# Patient Record
Sex: Male | Born: 2000 | Race: White | Hispanic: No | Marital: Single | Smoking: Never smoker
Health system: Southern US, Community
[De-identification: ages and names within clinical notes are randomized; demographics above are authoritative.]

## PROBLEM LIST (undated history)

## (undated) DIAGNOSIS — H9190 Unspecified hearing loss, unspecified ear: Secondary | ICD-10-CM

## (undated) DIAGNOSIS — J3081 Allergic rhinitis due to animal (cat) (dog) hair and dander: Secondary | ICD-10-CM

## (undated) DIAGNOSIS — IMO0001 Reserved for inherently not codable concepts without codable children: Secondary | ICD-10-CM

## (undated) DIAGNOSIS — R55 Syncope and collapse: Secondary | ICD-10-CM

## (undated) DIAGNOSIS — L929 Granulomatous disorder of the skin and subcutaneous tissue, unspecified: Secondary | ICD-10-CM

## (undated) HISTORY — DX: Syncope and collapse: R55

---
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
## 2021-04-16 ENCOUNTER — Encounter (HOSPITAL_BASED_OUTPATIENT_CLINIC_OR_DEPARTMENT_OTHER): Payer: Self-pay | Admitting: *Deleted

## 2021-04-16 ENCOUNTER — Emergency Department (HOSPITAL_BASED_OUTPATIENT_CLINIC_OR_DEPARTMENT_OTHER)
Admission: EM | Admit: 2021-04-16 | Discharge: 2021-04-16 | Disposition: A | Discharge status: Home / Self Care | Payer: Medicaid Other | Attending: Emergency Medicine | Admitting: Emergency Medicine

## 2021-04-16 ENCOUNTER — Other Ambulatory Visit: Payer: Self-pay

## 2021-04-16 DIAGNOSIS — L040 Acute lymphadenitis of face, head and neck: Secondary | ICD-10-CM | POA: Insufficient documentation

## 2021-04-16 DIAGNOSIS — Z7722 Contact with and (suspected) exposure to environmental tobacco smoke (acute) (chronic): Secondary | ICD-10-CM | POA: Insufficient documentation

## 2021-04-16 DIAGNOSIS — R590 Localized enlarged lymph nodes: Secondary | ICD-10-CM

## 2021-04-16 NOTE — ED Triage Notes (Signed)
Enlarged node on the left side of his neck. Pt is deaf.

## 2021-04-16 NOTE — Discharge Instructions (Addendum)
Recommend calling your primary care doctor to get follow-up appointment.  Discussed her symptoms from today.  In one of your neck lymph nodes on the left was mildly swollen.  They should recheck this.  If you develop fever, difficulty breathing, worsening neck swelling, or other new concerning symptom, come back to ER for reassessment.  For the aches and pains in your joints, recommend trying Motrin as needed.

## 2021-04-17 NOTE — ED Provider Notes (Signed)
[facility] EMERGENCY DEPARTMENT Provider Note   CSN: [id number] Arrival date & time: 04/16/21  2133     History Chief Complaint  Patient presents with   Lymphadenopathy    Jason Gentry is a 20 y.o. male.  Utilized [employer] for [employer].  Patient reports that over the past couple days he is noted small area of swelling on his left neck.  Slightly tender to touch but does not have any pain at present.  No generalized neck swelling, no neck stiffness, no fevers or chills.  No weight loss.  On review of systems he does endorse some joint pain, has had pain in some of his shoulders past few weeks has tried aspirin with no relief.  Not currently having any significant pain.  Denies any medical problems.  Deaf.  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    Past Surgical History:  Procedure Laterality Date   MASS EXCISION Right 04/03/2015   Procedure: EXCISION OF INFECTED GRANULOMA ON RIGHT SHOULDER;  Surgeon: Leonia Corona, MD;  Location: [facility];  Service: Pediatrics;  Laterality: Right;       Family History  Problem Relation Age of Onset   Hypertension Maternal Grandmother    Deafness Mother    Deafness Brother    Hypertension Maternal Grandfather    Migraines Neg Hx    Seizures Neg Hx    Depression Neg Hx    Anxiety disorder Neg Hx    Bipolar disorder Neg Hx    Schizophrenia Neg Hx    ADD / ADHD Neg Hx    Autism Neg Hx     Social History   Tobacco Use   Smoking status: Passive Smoke Exposure - Never Smoker   Smokeless tobacco: Never   Tobacco comments:    father smokes, but not at home   Substance Use Topics   Alcohol use: No   Drug use: No    Home Medications Prior to Admission medications   Medication Sig Start Date End Date Taking? Authorizing Provider  meclizine (ANTIVERT) 12.5 MG tablet Take 1 tablet (12.5 mg total) by mouth 3 (three) times daily as needed for dizziness. Patient not taking: No sig reported 10/08/17   Mackuen, Cindee Salt, MD    Allergies    Penicillins  Review of Systems   Review of Systems  Constitutional:  Negative for chills and fever.  HENT:  Negative for ear pain and sore throat.   Eyes:  Negative for pain and visual disturbance.  Respiratory:  Negative for cough and shortness of breath.   Cardiovascular:  Negative for chest pain and palpitations.  Gastrointestinal:  Negative for abdominal pain and vomiting.  Genitourinary:  Negative for dysuria and hematuria.  Musculoskeletal:  Positive for arthralgias and myalgias. Negative for back pain.  Skin:  Negative for color change and rash.  Neurological:  Negative for seizures and syncope.  All other systems reviewed and are negative.  Physical Exam Updated Vital Signs BP (!) 148/87 (BP Location: Left Arm)   Pulse (!) 104   Temp 98.6 F (  37 C) (Oral)   Resp 18   Ht 5\' 11"  (1.803 m)   Wt 70.7 kg   SpO2 100%   BMI 21.74 kg/m   Physical Exam Vitals and nursing note reviewed.  Constitutional:      Appearance: He is well-developed.  HENT:     Head: Normocephalic and atraumatic.     Mouth/Throat:     Mouth: Mucous membranes are moist.     Pharynx: Oropharynx is clear. No oropharyngeal exudate or posterior oropharyngeal erythema.  Eyes:     Conjunctiva/sclera: Conjunctivae normal.  Neck:     Comments: There is a less than 1 cm palpable cervical lymph node, firm, nontender, no overlying erythema or induration appreciated, neck is otherwise supple, normal range of motion Cardiovascular:     Rate and Rhythm: Normal rate and regular rhythm.     Heart sounds: No murmur  heard. Pulmonary:     Effort: Pulmonary effort is normal. No respiratory distress.     Breath sounds: Normal breath sounds.  Abdominal:     Palpations: Abdomen is soft.     Tenderness: There is no abdominal tenderness.  Musculoskeletal:     Cervical back: Neck supple.     Comments: No tenderness over shoulders, normal shoulder range of motion  Skin:    General: Skin is warm and dry.  Neurological:     Mental Status: He is alert.    ED Results / Procedures / Treatments   Labs (all labs ordered are listed, but only abnormal results are displayed) Labs Reviewed - No data to display  EKG None  Radiology No results found.  Procedures Procedures   Medications Ordered in ED Medications - No data to display  ED Course  I have reviewed the triage vital signs and the nursing notes.  Pertinent labs & imaging results that were available during my care of the patient were reviewed by me and considered in my medical decision making (see chart for details).    MDM Rules/Calculators/A&P                          20 year old presents to ER with concern for bump on his neck.  Based on physical exam suspect he has some lymphadenopathy.  HEENT exam otherwise unremarkable.  No erythema or induration or fever to suggest infectious process at present.  Recommend he follow-up with his primary doctor for recheck and monitoring.  If there is persistent swelling, worsening swelling, discussed with patient potential need for labs and imaging.  Given the size and appearance today however do not feel this is necessary. Demonstrated understanding of dc plan.    After the discussed management above, the patient was determined to be safe for discharge.  The patient was in agreement with this plan and all questions regarding their care were answered.  ED return precautions were discussed and the patient will return to the ED with any significant worsening of condition.  Final Clinical Impression(s) / ED  Diagnoses Final diagnoses:  Cervical lymphadenopathy    Rx / DC Orders ED Discharge Orders     None        15, MD 04/17/21 1646

## 2021-04-30 ENCOUNTER — Encounter (HOSPITAL_BASED_OUTPATIENT_CLINIC_OR_DEPARTMENT_OTHER): Payer: Self-pay | Admitting: *Deleted

## 2021-04-30 ENCOUNTER — Other Ambulatory Visit: Payer: Self-pay

## 2021-04-30 DIAGNOSIS — M549 Dorsalgia, unspecified: Secondary | ICD-10-CM | POA: Insufficient documentation

## 2021-04-30 DIAGNOSIS — R0781 Pleurodynia: Secondary | ICD-10-CM | POA: Diagnosis present

## 2021-04-30 LAB — URINALYSIS, ROUTINE W REFLEX MICROSCOPIC
Bilirubin Urine: NEGATIVE
Glucose, UA: NEGATIVE mg/dL
Hgb urine dipstick: NEGATIVE
Ketones, ur: NEGATIVE mg/dL
Leukocytes,Ua: NEGATIVE
Nitrite: NEGATIVE
Protein, ur: NEGATIVE mg/dL
Specific Gravity, Urine: 1.015 (ref 1.005–1.030)
pH: 7.5 (ref 5.0–8.0)

## 2021-04-30 NOTE — ED Triage Notes (Signed)
Left flank pain. He feels it is kidney related.

## 2021-05-01 ENCOUNTER — Emergency Department (HOSPITAL_BASED_OUTPATIENT_CLINIC_OR_DEPARTMENT_OTHER): Payer: Medicaid Other

## 2021-05-01 ENCOUNTER — Emergency Department (HOSPITAL_BASED_OUTPATIENT_CLINIC_OR_DEPARTMENT_OTHER)
Admission: EM | Admit: 2021-05-01 | Discharge: 2021-05-01 | Disposition: A | Discharge status: Home / Self Care | Payer: Medicaid Other | Attending: Emergency Medicine | Admitting: Emergency Medicine

## 2021-05-01 DIAGNOSIS — R0789 Other chest pain: Secondary | ICD-10-CM

## 2021-05-01 MED ORDER — NAPROXEN 375 MG PO TABS: ORAL_TABLET | ORAL | 0 refills | Status: DC

## 2021-05-01 MED ORDER — NAPROXEN 250 MG PO TABS
500.0000 mg | ORAL_TABLET | Freq: Once | ORAL | Status: AC
  Administered 2021-05-01: 500 mg via ORAL
  Filled 2021-05-01: qty 2

## 2021-05-01 NOTE — ED Provider Notes (Signed)
[facility] Provider Note: Jason Dell, MD, FACEP  CSN: [id number] MRN: [id number] ARRIVAL: 04/30/21 at 2119 ROOM: [facility]01/[facility]01   CHIEF COMPLAINT  Back Pain  Patient is deaf.  No in-person or Internet sign language interpreter available.  History was obtained through written questions and answers. HISTORY OF PRESENT ILLNESS  05/01/21 2:56 AM Jason Rushing D Gentry is a 20 y.o. male who has had left lower posterolateral rib pain for about the past 3 weeks.  He describes it as well localized and fairly sharp.  He rates it as about a 5 out of 10.  It is not worse with movement but is somewhat worse with breathing.  He describes it as a deep pain.  He is not short of breath.  He may have done some heavy lifting that precipitated this but he is not sure.  He states the pain is constant and he has taken ibuprofen without adequate relief.   Past Medical History:  Diagnosis Date   Allergic to cats    Deaf    uses sign language   Deaf    Granuloma of skin 03/2015   infected granuloma right shoulder   Hearing impaired     Past Surgical History:  Procedure Laterality Date   MASS EXCISION Right 04/03/2015   Procedure: EXCISION OF INFECTED GRANULOMA ON RIGHT SHOULDER;  Surgeon: Leonia Corona, MD;  Location: [facility];  Service: Pediatrics;  Laterality: Right;    Family History  Problem Relation Age of Onset   Hypertension Maternal Grandmother    Deafness Mother    Deafness Brother    Hypertension Maternal Grandfather    Migraines Neg Hx    Seizures Neg Hx    Depression Neg Hx    Anxiety disorder Neg Hx    Bipolar disorder Neg Hx    Schizophrenia Neg Hx    ADD / ADHD Neg Hx    Autism Neg Hx     Social History   Tobacco Use   Smoking status: Never    Passive exposure: Yes   Smokeless tobacco: Never   Tobacco comments:    father smokes, but not at home  Vaping Use   Vaping Use: Never used  Substance Use Topics   Alcohol use: No   Drug use: No     Prior to Admission medications   Medication Sig Start Date End Date Taking? Authorizing Provider  naproxen (NAPROSYN) 375 MG tablet Take 1 tablet twice daily as needed for chest wall pain. 05/01/21  Yes Rayn Shorb, MD    Allergies Penicillins   REVIEW OF SYSTEMS  Negative except as noted here or in the History of Present Illness.   PHYSICAL EXAMINATION  Initial Vital Signs Blood pressure (!) 140/91, pulse 83, temperature 98.8 F (37.1 C), temperature source Oral, resp. rate 18, height 5\' 11"  (1.803 m), weight 70.7 kg, SpO2 100 %.  Examination General: Well-developed, well-nourished male in no acute distress; appearance consistent with age of record HENT: normocephalic; atraumatic Eyes: Normal appearance Neck: supple Heart: regular rate and rhythm Lungs: clear to auscultation bilaterally; equivocal point tenderness of ribs at location described above Abdomen: soft; nondistended; nontender; bowel sounds present Extremities: No deformity; full range of motion Neurologic: Awake, alert and oriented; motor function intact in all extremities and symmetric; no facial droop; deaf Skin: Warm and dry Psychiatric: Normal mood and affect   RESULTS  Summary of this visit's results, reviewed and interpreted by myself:   EKG Interpretation  Date/Time:  Ventricular Rate:    PR Interval:    QRS Duration:   QT Interval:    QTC Calculation:   R Axis:     Text Interpretation:          Laboratory Studies: Results for orders placed or performed during the hospital encounter of 05/01/21 (from the past 24 hour(s))  Urinalysis, Routine w reflex microscopic Urine, Clean Catch     Status: None   Collection Time: 04/30/21  9:42 PM  Result Value Ref Range   Color, Urine YELLOW YELLOW   APPearance CLEAR CLEAR   Specific Gravity, Urine 1.015 1.005 - 1.030   pH 7.5 5.0 - 8.0   Glucose, UA NEGATIVE NEGATIVE mg/dL   Hgb urine dipstick NEGATIVE NEGATIVE   Bilirubin Urine NEGATIVE  NEGATIVE   Ketones, ur NEGATIVE NEGATIVE mg/dL   Protein, ur NEGATIVE NEGATIVE mg/dL   Nitrite NEGATIVE NEGATIVE   Leukocytes,Ua NEGATIVE NEGATIVE   Imaging Studies: DG Ribs Unilateral W/Chest Left  Result Date: 05/01/2021 CLINICAL DATA:  Left flank pain EXAM: LEFT RIBS AND CHEST - 3+ VIEW COMPARISON:  None. FINDINGS: Lungs are clear.   No pleural effusion or pneumothorax. The heart is normal in size. No displaced left rib fracture is seen. IMPRESSION: No evidence of acute cardiopulmonary disease. No displaced left rib fracture is seen. Electronically Signed   By: Charline Bills M.D.   On: 05/01/2021 03:33    ED COURSE and MDM  Nursing notes, initial and subsequent vitals signs, including pulse oximetry, reviewed and interpreted by myself.  Vitals:   04/30/21 2134 04/30/21 2135 05/01/21 0316  BP:  (!) 140/91 138/84  Pulse:  83 77  Resp:  18 16  Temp:  98.8 F (37.1 C)   TempSrc:  Oral   SpO2:  100% 100%  Weight: 70.7 kg    Height: 5\' 11"  (1.803 m)     Medications  naproxen (NAPROSYN) tablet 500 mg (has no administration in time range)   The cause of the patient's pain is unclear.  His x-ray is normal.  He is not particularly tender at the site of the pain and there may be a pleuritic component.  I have a very low suspicion of PE given that the pain has been there for several weeks, his vital signs are normal and he has no risk factors.  PROCEDURES  Procedures   ED DIAGNOSES     ICD-10-CM   1. Acute chest wall pain  R07.89          01-02-1987, MD 05/01/21 [phone]

## 2022-11-27 IMAGING — DX DG RIBS W/ CHEST 3+V*L*
3 series · 3 of 3 positions shown · non-contrast
Comparison: None.

CLINICAL DATA: Left flank pain

EXAM:
LEFT RIBS AND CHEST - 3+ VIEW

[chest pa]
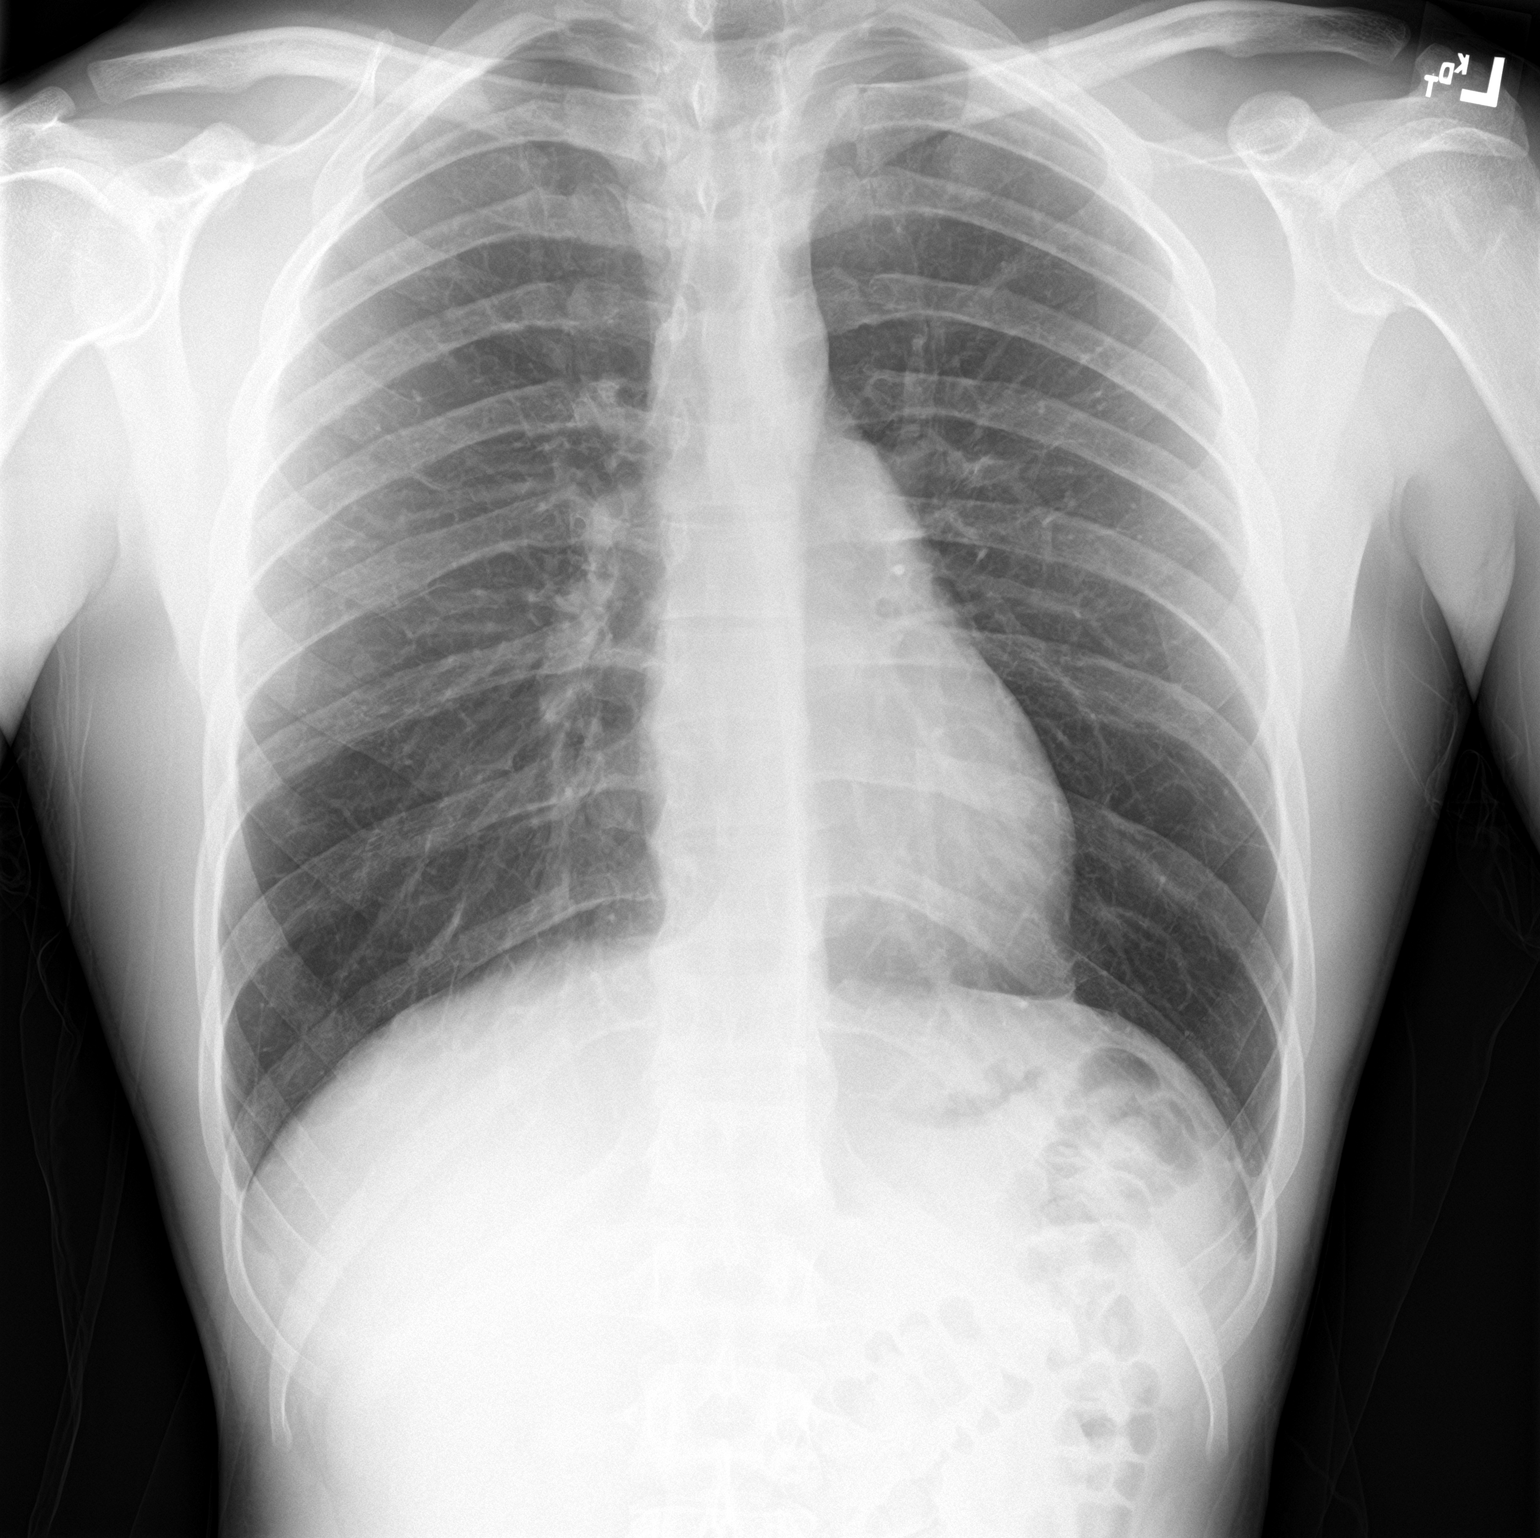

[rib ap]
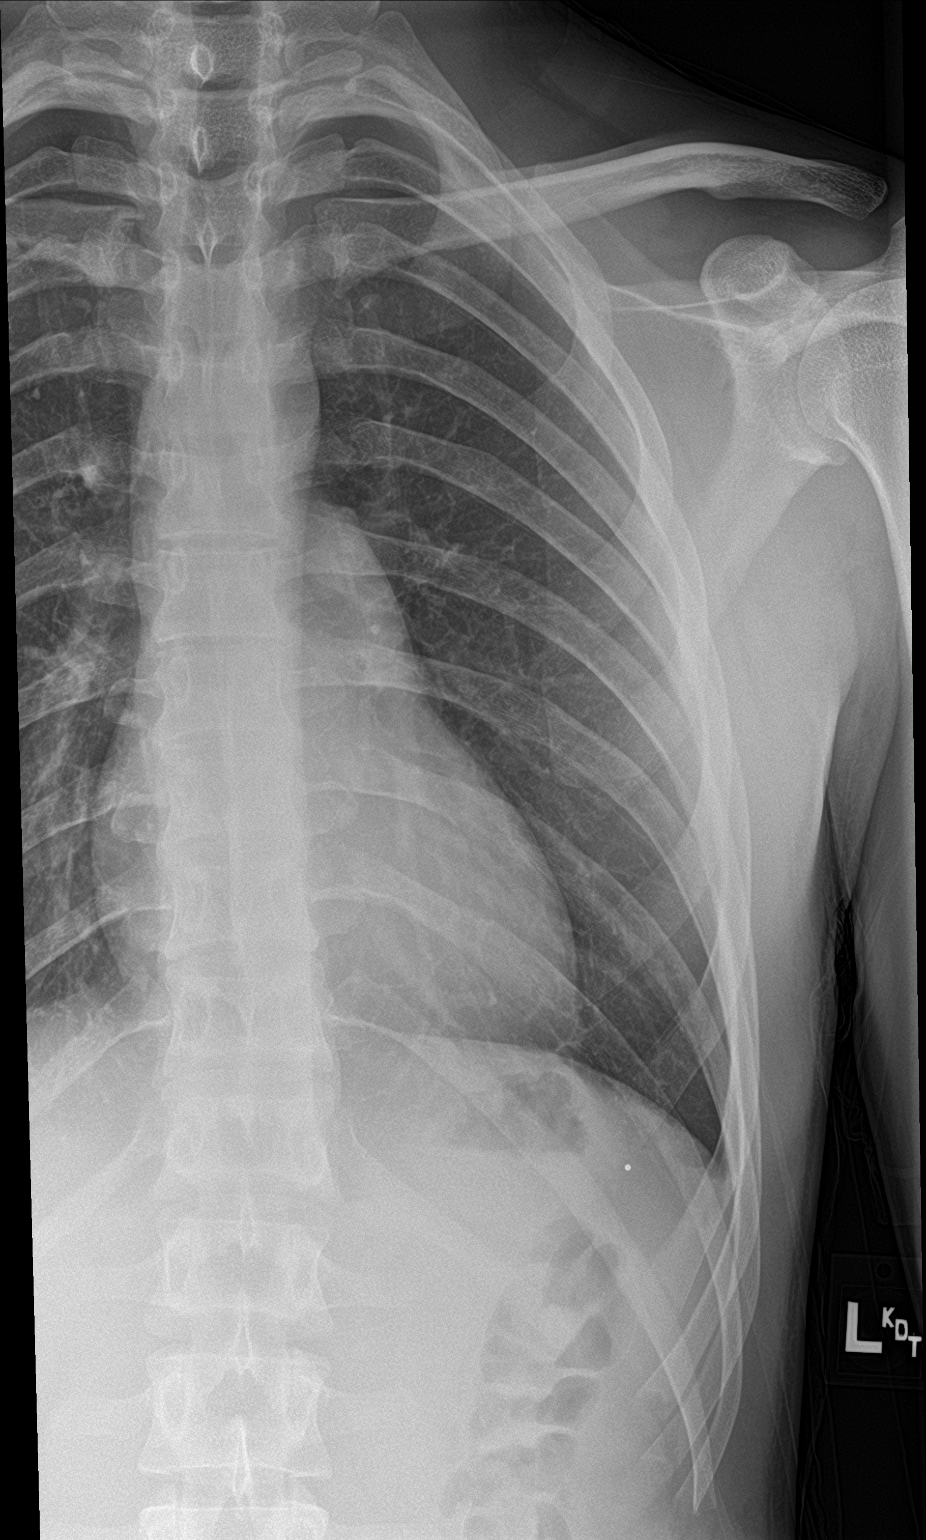

[rib ap obl]
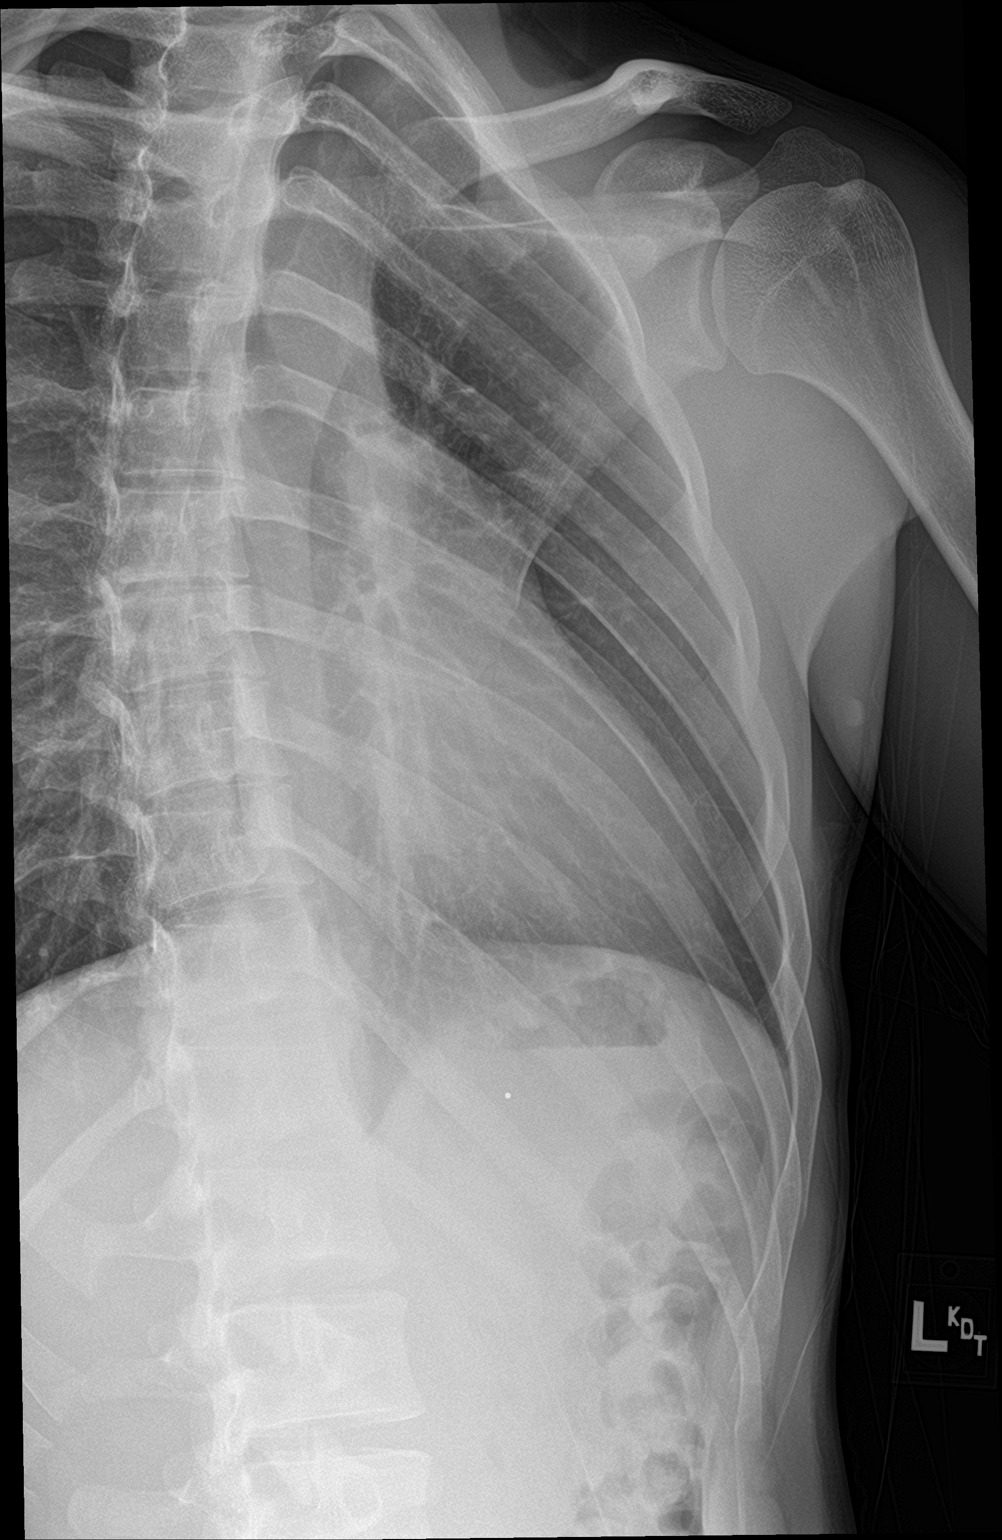

[3 of 3 positions shown; findings below may reference images not displayed]

FINDINGS: Lungs are clear.   No pleural effusion or pneumothorax.

The heart is normal in size.

No displaced left rib fracture is seen.
IMPRESSION: No evidence of acute cardiopulmonary disease.

No displaced left rib fracture is seen.

## 2022-12-27 DIAGNOSIS — Z23 Encounter for immunization: Secondary | ICD-10-CM | POA: Diagnosis not present

## 2022-12-27 DIAGNOSIS — Z13220 Encounter for screening for lipoid disorders: Secondary | ICD-10-CM | POA: Diagnosis not present

## 2022-12-27 DIAGNOSIS — Z0000 Encounter for general adult medical examination without abnormal findings: Secondary | ICD-10-CM | POA: Diagnosis not present

## 2023-04-10 DIAGNOSIS — S59901A Unspecified injury of right elbow, initial encounter: Secondary | ICD-10-CM | POA: Diagnosis not present

## 2023-04-10 DIAGNOSIS — W1830XA Fall on same level, unspecified, initial encounter: Secondary | ICD-10-CM | POA: Diagnosis not present

## 2023-04-10 DIAGNOSIS — M25521 Pain in right elbow: Secondary | ICD-10-CM | POA: Diagnosis not present

## 2023-04-10 DIAGNOSIS — M25572 Pain in left ankle and joints of left foot: Secondary | ICD-10-CM | POA: Diagnosis not present

## 2023-04-10 DIAGNOSIS — M25551 Pain in right hip: Secondary | ICD-10-CM | POA: Diagnosis not present

## 2023-08-17 DIAGNOSIS — M9903 Segmental and somatic dysfunction of lumbar region: Secondary | ICD-10-CM | POA: Diagnosis not present

## 2023-08-17 DIAGNOSIS — M5417 Radiculopathy, lumbosacral region: Secondary | ICD-10-CM | POA: Diagnosis not present

## 2023-08-17 DIAGNOSIS — M9901 Segmental and somatic dysfunction of cervical region: Secondary | ICD-10-CM | POA: Diagnosis not present

## 2023-08-17 DIAGNOSIS — M5413 Radiculopathy, cervicothoracic region: Secondary | ICD-10-CM | POA: Diagnosis not present

## 2023-08-24 DIAGNOSIS — M9903 Segmental and somatic dysfunction of lumbar region: Secondary | ICD-10-CM | POA: Diagnosis not present

## 2023-08-24 DIAGNOSIS — M5417 Radiculopathy, lumbosacral region: Secondary | ICD-10-CM | POA: Diagnosis not present

## 2023-08-24 DIAGNOSIS — M9901 Segmental and somatic dysfunction of cervical region: Secondary | ICD-10-CM | POA: Diagnosis not present

## 2023-08-24 DIAGNOSIS — M5413 Radiculopathy, cervicothoracic region: Secondary | ICD-10-CM | POA: Diagnosis not present

## 2023-10-07 DIAGNOSIS — R519 Headache, unspecified: Secondary | ICD-10-CM | POA: Diagnosis not present

## 2023-10-07 DIAGNOSIS — M79601 Pain in right arm: Secondary | ICD-10-CM | POA: Diagnosis not present

## 2023-10-07 DIAGNOSIS — R5383 Other fatigue: Secondary | ICD-10-CM | POA: Diagnosis not present

## 2023-10-07 DIAGNOSIS — R531 Weakness: Secondary | ICD-10-CM | POA: Diagnosis not present

## 2023-10-07 DIAGNOSIS — M79604 Pain in right leg: Secondary | ICD-10-CM | POA: Diagnosis not present

## 2023-10-07 DIAGNOSIS — R079 Chest pain, unspecified: Secondary | ICD-10-CM | POA: Diagnosis not present

## 2023-10-07 DIAGNOSIS — R930 Abnormal findings on diagnostic imaging of skull and head, not elsewhere classified: Secondary | ICD-10-CM | POA: Diagnosis not present

## 2023-10-07 DIAGNOSIS — R9431 Abnormal electrocardiogram [ECG] [EKG]: Secondary | ICD-10-CM | POA: Diagnosis not present

## 2023-10-07 DIAGNOSIS — G9389 Other specified disorders of brain: Secondary | ICD-10-CM | POA: Diagnosis not present

## 2023-10-08 DIAGNOSIS — I671 Cerebral aneurysm, nonruptured: Secondary | ICD-10-CM | POA: Insufficient documentation

## 2023-10-08 DIAGNOSIS — R079 Chest pain, unspecified: Secondary | ICD-10-CM | POA: Diagnosis not present

## 2023-10-08 HISTORY — DX: Cerebral aneurysm, nonruptured: I67.1

## 2023-10-20 ENCOUNTER — Emergency Department (HOSPITAL_BASED_OUTPATIENT_CLINIC_OR_DEPARTMENT_OTHER): Payer: BC Managed Care – PPO

## 2023-10-20 ENCOUNTER — Other Ambulatory Visit: Payer: Self-pay

## 2023-10-20 ENCOUNTER — Emergency Department (HOSPITAL_BASED_OUTPATIENT_CLINIC_OR_DEPARTMENT_OTHER)
Admission: EM | Admit: 2023-10-20 | Discharge: 2023-10-21 | Disposition: A | Discharge status: Home / Self Care | Payer: BC Managed Care – PPO | Attending: Emergency Medicine | Admitting: Emergency Medicine

## 2023-10-20 ENCOUNTER — Encounter (HOSPITAL_BASED_OUTPATIENT_CLINIC_OR_DEPARTMENT_OTHER): Payer: Self-pay

## 2023-10-20 DIAGNOSIS — J101 Influenza due to other identified influenza virus with other respiratory manifestations: Secondary | ICD-10-CM | POA: Insufficient documentation

## 2023-10-20 DIAGNOSIS — Z20822 Contact with and (suspected) exposure to covid-19: Secondary | ICD-10-CM | POA: Diagnosis not present

## 2023-10-20 DIAGNOSIS — R519 Headache, unspecified: Secondary | ICD-10-CM

## 2023-10-20 DIAGNOSIS — I671 Cerebral aneurysm, nonruptured: Secondary | ICD-10-CM | POA: Insufficient documentation

## 2023-10-20 DIAGNOSIS — R55 Syncope and collapse: Secondary | ICD-10-CM | POA: Diagnosis not present

## 2023-10-20 DIAGNOSIS — J111 Influenza due to unidentified influenza virus with other respiratory manifestations: Secondary | ICD-10-CM

## 2023-10-20 LAB — BASIC METABOLIC PANEL
Anion gap: 8 (ref 5–15)
BUN: 17 mg/dL (ref 6–20)
CO2: 25 mmol/L (ref 22–32)
Calcium: 8.9 mg/dL (ref 8.9–10.3)
Chloride: 101 mmol/L (ref 98–111)
Creatinine, Ser: 1.05 mg/dL (ref 0.61–1.24)
GFR, Estimated: >60 mL/min (ref >60)
Glucose, Bld: 162 mg/dL — ABNORMAL HIGH (ref 70–99)
Potassium: 3.7 mmol/L (ref 3.5–5.1)
Sodium: 134 mmol/L — ABNORMAL LOW (ref 135–145)

## 2023-10-20 LAB — RESP PANEL BY RT-PCR (RSV, FLU A&B, COVID)  RVPGX2
Influenza A by PCR: POSITIVE — AB
Influenza B by PCR: NEGATIVE
Resp Syncytial Virus by PCR: NEGATIVE
SARS Coronavirus 2 by RT PCR: NEGATIVE

## 2023-10-20 LAB — CBC
HCT: 42.9 % (ref 39.0–52.0)
Hemoglobin: 15.1 g/dL (ref 13.0–17.0)
MCH: 30.9 pg (ref 26.0–34.0)
MCHC: 35.2 g/dL (ref 30.0–36.0)
MCV: 87.7 fL (ref 80.0–100.0)
Platelets: 295 10*3/uL (ref 150–400)
RBC: 4.89 MIL/uL (ref 4.22–5.81)
RDW: 11.6 % (ref 11.5–15.5)
WBC: 6.2 10*3/uL (ref 4.0–10.5)
nRBC: 0 % (ref 0.0–0.2)

## 2023-10-20 LAB — TROPONIN I (HIGH SENSITIVITY): Troponin I (High Sensitivity): <2 ng/L (ref <18)

## 2023-10-20 LAB — CBG MONITORING, ED: Glucose-Capillary: 176 mg/dL — ABNORMAL HIGH (ref 70–99)

## 2023-10-20 MED ORDER — SODIUM CHLORIDE 0.9 % IV BOLUS
1000.0000 mL | Freq: Once | INTRAVENOUS | Status: AC
  Administered 2023-10-20: 1000 mL via INTRAVENOUS

## 2023-10-20 MED ORDER — IOHEXOL 350 MG/ML SOLN
75.0000 mL | Freq: Once | INTRAVENOUS | Status: AC | PRN
  Administered 2023-10-20: 75 mL via INTRAVENOUS

## 2023-10-20 NOTE — Discharge Instructions (Signed)
You were seen for passing out and your headache in the emergency department.   At home, please take Tylenol and ibuprofen for your headache.  Please stay well-hydrated to prevent passing out.  Check your MyChart online for the results of any tests that had not resulted by the time you left the emergency department.   Follow-up with your primary doctor in 2-3 days regarding your visit.  Cardiology will call you about an appointment within the next 72 business hours.  Return immediately to the emergency department if you experience any of the following: Current passing out, worsening headache, or any other concerning symptoms.    Thank you for visiting our Emergency Department. It was a pleasure taking care of you today.

## 2023-10-20 NOTE — ED Triage Notes (Signed)
The patient had a syncopal episode today and hit his head. He has recent dx of a brain aneurysm. He is having pain to the right side of his body. He stated this was what happened last week when he was dx with the aneurysm.

## 2023-10-20 NOTE — ED Notes (Signed)
Extra LT Blue, Drk Galen [place], and [employer] top sent to lab for hold.

## 2023-10-20 NOTE — ED Provider Notes (Signed)
[place] [facility] AT [facility] Provider Note   CSN: [id number] Arrival date & time: 10/20/23  2043     History  Chief Complaint  Patient presents with   Fall   Head Injury    Jason Gentry is a 22 y.o. male.  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  Says that over the past 3 weeks has been having a headache.  Sharp, right-sided and currently 5/10 in severity.  On 10/07/2023 he went to an emergency department where he had a CT scan had suboptimal bolus timing but showed a possible brain aneurysm.  Was discussed with neurosurgery and discharged home for outpatient follow-up.  Says that he has had some congestion over the past few days.  Headache has remained stable but today he was walking upstairs, felt lightheaded, and passed out.  Did fall and hit the ground.  Had full loss of consciousness.  Had 3 additional episodes that were witnessed by others in the waiting room while he was in a wheelchair that lasted a few seconds and he was back to baseline.  No shaking.  No chest pain or shortness of breath.  No personal history of arrhythmia.  No family history of sudden cardiac death or unexplained death.  History obtained via onsite interpreter.       Home Medications Prior to Admission medications   Medication Sig Start Date End Date Taking? Authorizing Provider  naproxen (NAPROSYN) 375 MG tablet Take 1 tablet twice daily as needed for chest wall pain. 05/01/21   Molpus, John, MD      Allergies    Penicillins    Review of Systems   Review of Systems  Physical Exam Updated Vital Signs BP 120/75   Pulse 96   Temp 98.9 F (37.2 C) (Oral)   Resp 15   Ht 5\' 11"  (1.803 m)   Wt 72.6 kg   SpO2 100%   BMI 22.32 kg/m  Physical Exam Vitals and nursing note reviewed.  Constitutional:      General: He is not in acute distress.    Appearance: He is well-developed.  HENT:      Head: Normocephalic and atraumatic.     Right Ear: External ear normal.     Left Ear: External ear normal.     Nose: Nose normal.  Eyes:     Extraocular Movements: Extraocular movements intact.     Conjunctiva/sclera: Conjunctivae normal.     Pupils: Pupils are equal, round, and reactive to light.  Cardiovascular:     Rate and Rhythm: Normal rate and regular rhythm.     Heart sounds: Normal heart sounds.  Pulmonary:     Effort: Pulmonary effort is normal. No respiratory distress.     Breath sounds: Normal breath sounds.  Musculoskeletal:     Cervical back: Normal range of motion and neck supple.     Right lower leg: No edema.     Left lower leg: No edema.  Skin:    General: Skin is warm and dry.  Neurological:     Mental Status: He is alert. Mental status is at baseline.     Comments: MENTAL STATUS: AAOx3 CRANIAL NERVES: II: Pupils equal and reactive 4 mm BL, no RAPD, no VF deficits III, IV, VI: EOM intact, no gaze preference or deviation, no nystagmus. V: normal sensation to light touch in V1, V2, and V3 segments bilaterally VII: no facial weakness or asymmetry,  no nasolabial fold flattening VIII: normal hearing to speech and finger friction IX, X: normal palatal elevation, no uvular deviation XI: 5/5 head turn and 5/5 shoulder shrug bilaterally XII: midline tongue protrusion MOTOR: 5/5 strength in R shoulder flexion, elbow flexion and extension, and grip strength. 5/5 strength in L shoulder flexion, elbow flexion and extension, and grip strength.  5/5 strength in R hip and knee flexion, knee extension, ankle plantar and dorsiflexion. 5/5 strength in L hip and knee flexion, knee extension, ankle plantar and dorsiflexion. SENSORY: Normal sensation to light touch in all extremities COORD: Normal finger to nose, no tremor, no dysmetria  Psychiatric:        Mood and Affect: Mood normal.        Behavior: Behavior normal.     ED Results / Procedures / Treatments   Labs (all  labs ordered are listed, but only abnormal results are displayed) Labs Reviewed  RESP PANEL BY RT-PCR (RSV, FLU A&B, COVID)  RVPGX2 - Abnormal; Notable for the following components:      Result Value   Influenza A by PCR POSITIVE (*)    All other components within normal limits  BASIC METABOLIC PANEL - Abnormal; Notable for the following components:   Sodium 134 (*)    Glucose, Bld 162 (*)    All other components within normal limits  CBG MONITORING, ED - Abnormal; Notable for the following components:   Glucose-Capillary 176 (*)    All other components within normal limits  CBC  TROPONIN I (HIGH SENSITIVITY)    EKG EKG Interpretation Date/Time:  Thursday October 20 2023 21:01:37 EST Ventricular Rate:  112 PR Interval:  129 QRS Duration:  110 QT Interval:  291 QTC Calculation: 398 R Axis:   87  Text Interpretation: Sinus tachycardia LVH with IVCD and secondary repol abnrm Confirmed by Vonita Moss [phone]) on 10/20/2023 9:17:31 PM  Radiology CT ANGIO HEAD NECK W WO CM Result Date: 10/20/2023 CLINICAL DATA:  Syncopal episode, history of aneurysm on prior imaging, pain on the right side of his body EXAM: CT ANGIOGRAPHY HEAD AND NECK WITH AND WITHOUT CONTRAST TECHNIQUE: Multidetector CT imaging of the head and neck was performed using the standard protocol during bolus administration of intravenous contrast. Multiplanar CT image reconstructions and MIPs were obtained to evaluate the vascular anatomy. Carotid stenosis measurements (when applicable) are obtained utilizing NASCET criteria, using the distal internal carotid diameter as the denominator. RADIATION DOSE REDUCTION: This exam was performed according to the departmental dose-optimization program which includes automated exposure control, adjustment of the mA and/or kV according to patient size and/or use of iterative reconstruction technique. CONTRAST:  75mL OMNIPAQUE IOHEXOL 350 MG/ML SOLN COMPARISON:  None Available.  FINDINGS: CT HEAD FINDINGS Brain: No evidence of acute infarct, hemorrhage, mass, mass effect, or midline shift. No hydrocephalus or extra-axial fluid collection. Vascular: No hyperdense vessel. Skull: Negative for fracture or focal lesion. Sinuses/Orbits: No acute finding. Other: The mastoid air cells are well aerated. CTA NECK FINDINGS Aortic arch: Standard branching. Imaged portion shows no evidence of aneurysm or dissection. No significant stenosis of the major arch vessel origins. Right carotid system: No evidence of stenosis, dissection, or occlusion. Left carotid system: No evidence of stenosis, dissection, or occlusion. Vertebral arteries: No evidence of stenosis, dissection, or occlusion. Skeleton: No acute osseous abnormality. Other neck: No acute finding. Upper chest: No focal pulmonary opacity or pleural effusion. Review of the MIP images confirms the above findings CTA HEAD FINDINGS Anterior circulation: Both internal carotid  arteries are patent to the termini, without significant stenosis. 1-2 mm posteriorly directed outpouching from the left ICA terminus (series 605, image 124), which may represent a tiny aneurysm versus an infundibulum. A1 segments patent. Normal anterior communicating artery. Anterior cerebral arteries are patent to their distal aspects without significant stenosis. No M1 stenosis or occlusion. MCA branches perfused to their distal aspects without significant stenosis. Posterior circulation: Vertebral arteries patent to the vertebrobasilar junction without significant stenosis. Posterior inferior cerebellar arteries patent proximally. Basilar patent to its distal aspect without significant stenosis. Superior cerebellar arteries patent proximally. Patent P1 segments, hypoplastic on the right. Near fetal origin of the right PCA from the right posterior communicating artery. PCAs perfused to their distal aspects without significant stenosis. The left posterior communicating artery is  not visualized. Venous sinuses: As permitted by contrast timing, patent. Anatomic variants: Near fetal origin of the right PCA. No evidence of vascular malformation. Review of the MIP images confirms the above findings IMPRESSION: 1. No acute intracranial process. 2. No intracranial large vessel occlusion or significant stenosis. 3. No hemodynamically significant stenosis in the neck. 4. 1-2 mm posteriorly directed outpouching from the left ICA terminus, which may represent a tiny aneurysm versus an infundibulum. Electronically Signed   By: Wiliam Ke M.D.   On: 10/20/2023 23:35   DG Chest Portable 1 View Result Date: 10/20/2023 CLINICAL DATA:  Syncope EXAM: PORTABLE CHEST 1 VIEW COMPARISON:  05/01/2021 FINDINGS: The heart size and mediastinal contours are within normal limits. Both lungs are clear. The visualized skeletal structures are unremarkable. IMPRESSION: Normal study. Electronically Signed   By: Charlett Nose M.D.   On: 10/20/2023 22:14    Procedures Procedures    Medications Ordered in ED Medications  sodium chloride 0.9 % bolus 1,000 mL ( Intravenous Stopped 10/20/23 2321)  iohexol (OMNIPAQUE) 350 MG/ML injection 75 mL (75 mLs Intravenous Contrast Given 10/20/23 2208)    ED Course/ Medical Decision Making/ A&P Clinical Course as of 10/21/23 1028  Thu Oct 20, 2023  2311 Discussed with radiology. 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 Does not appear to have ruptured.  [RP]  2342 Megan from [facility] consulted.  Does not feel that the aneurysm is causing any of his symptoms.  Agrees that outpatient follow-up for the aneurysm is appropriate. [RP]    Clinical Course User Index [RP] Rondel Baton, MD                                 Medical Decision Making Amount and/or Complexity of Data Reviewed Labs: ordered. Radiology: ordered.  Risk Prescription drug management.   Oronde Kempel Bevins is a 22 y.o. male with comorbidities that complicate the patient  evaluation including congenital deafness who presents emergency department with headache and syncope.    Initial Ddx:  Subarachnoid hemorrhage, aneurysm rupture, cardiogenic syncope, orthostasis, dehydration  MDM/Course:  Patient presents emergency department with syncopal event.  Appeared after he stood up and walked up some stairs.  Did have several additional episodes of syncope that occurred while he was not standing up though.  Does have a history of a possible brain aneurysm that had previously been discussed with neurosurgery who recommended outpatient follow-up.  Still having a headache on his right side at this time.  On exam has no focal neurologic deficits.  Blood pressure and other vital signs WNL.  He had a repeat CTA today with a  better bolus timing which does show a possible small left ICA 2 mm aneurysm versus infundibulum.  Discussed with our neurosurgical colleagues who felt that outpatient treatment was still reasonable and that the small aneurysm likely was not the cause of his symptoms.  He was ultimately found to have influenza and though his orthostatics were not technically positive he was dizzy with standing and was given some IV fluids.  I suspect that that was the cause of his syncope.  EKG without concerning features such as Brugada, long QT, or WPW.  Will have him follow-up with cardiology given his syncopal events especially since some of them did occur while he was at rest to make sure that he does not need additional evaluation.  This patient presents to the ED for concern of complaints listed in HPI, this involves an extensive number of treatment options, and is a complaint that carries with it a high risk of complications and morbidity. Disposition including potential need for admission considered.   Dispo: DC Home. Return precautions discussed including, but not limited to, those listed in the AVS. Allowed pt time to ask questions which were answered fully prior to  dc.  Additional history obtained from  caretaker Records reviewed Outpatient Clinic Notes The following labs were independently interpreted: Chemistry and show no acute abnormality I independently reviewed the following imaging with scope of interpretation limited to determining acute life threatening conditions related to emergency care: Chest x-ray and agree with the radiologist interpretation with the following exceptions: none I personally reviewed and interpreted cardiac monitoring: normal sinus rhythm  I personally reviewed and interpreted the pt's EKG: see above for interpretation  I have reviewed the patients home medications and made adjustments as needed Consults: Neurosurgery Social Determinants of health:  American sign language speaking  Portions of this note were generated with Scientist, clinical (histocompatibility and immunogenetics). Dictation errors may occur despite best attempts at proofreading.     Final Clinical Impression(s) / ED Diagnoses Final diagnoses:  Syncope and collapse  Nonintractable headache, unspecified chronicity pattern, unspecified headache type  Aneurysm, cerebral, nonruptured  Influenza    Rx / DC Orders ED Discharge Orders          Ordered    Ambulatory referral to Cardiology        10/20/23 2344              Rondel Baton, MD 10/21/23 1028

## 2023-10-20 NOTE — ED Notes (Signed)
Pt sig other also reports he has not been feeling well and has a low grade fever, pt agreeable for resp viral swab.

## 2023-10-20 NOTE — ED Notes (Signed)
Patient transported to CT 

## 2023-10-21 NOTE — ED Notes (Signed)

## 2023-11-04 ENCOUNTER — Ambulatory Visit: Payer: Medicare Other | Admitting: Cardiology

## 2023-11-11 DIAGNOSIS — R42 Dizziness and giddiness: Secondary | ICD-10-CM | POA: Diagnosis not present

## 2023-12-27 DIAGNOSIS — I729 Aneurysm of unspecified site: Secondary | ICD-10-CM | POA: Diagnosis not present

## 2023-12-27 DIAGNOSIS — Z0000 Encounter for general adult medical examination without abnormal findings: Secondary | ICD-10-CM | POA: Diagnosis not present

## 2023-12-27 DIAGNOSIS — I479 Paroxysmal tachycardia, unspecified: Secondary | ICD-10-CM | POA: Diagnosis not present

## 2023-12-28 ENCOUNTER — Ambulatory Visit: Payer: Medicare Other

## 2023-12-28 VITALS — BP 130/80 | HR 73 | Ht 72.0 in | Wt 158.0 lb

## 2023-12-28 DIAGNOSIS — J3081 Allergic rhinitis due to animal (cat) (dog) hair and dander: Secondary | ICD-10-CM | POA: Insufficient documentation

## 2023-12-28 DIAGNOSIS — I4510 Unspecified right bundle-branch block: Secondary | ICD-10-CM | POA: Diagnosis not present

## 2023-12-28 DIAGNOSIS — R55 Syncope and collapse: Secondary | ICD-10-CM | POA: Insufficient documentation

## 2023-12-28 DIAGNOSIS — H9190 Unspecified hearing loss, unspecified ear: Secondary | ICD-10-CM | POA: Insufficient documentation

## 2023-12-28 NOTE — Assessment & Plan Note (Signed)
 Most recent episode on December 26 appears to be a vasovagal episode. Unclear of what the etiology of the syncope that occurred 2 years ago while he was [place] the beach.  Otherwise healthy no significant cardio pulmonary symptoms or functional limitations.  He does have a smart watch from that he is able to record ECG tracings on his phone and multiple of these reviewed showed normal sinus rhythm.  In the setting we will obtain a transthoracic echocardiogram to rule out any significant cardiac structural and functional abnormalities. Given infrequent symptoms no further cardiac monitoring being pursued. Reassured him about vagal syncope being usually a benign condition. Advised him to take precautions to avoid syncope at the onset of symptoms by sitting down or laying down.   Advised him to keep himself well-hydrated

## 2023-12-28 NOTE — Patient Instructions (Signed)
 Medication Instructions:  Your physician recommends that you continue on your current medications as directed. Please refer to the Current Medication list given to you today.  *If you need a refill on your cardiac medications before your next appointment, please call your pharmacy*   Lab Work: None ordered If you have labs (blood work) drawn today and your tests are completely normal, you will receive your results only by: MyChart Message (if you have MyChart) OR A paper copy in the mail If you have any lab test that is abnormal or we need to change your treatment, we will call you to review the results.   Testing/Procedures: Your physician has requested that you have an echocardiogram. Echocardiography is a painless test that uses sound waves to create images of your heart. It provides your doctor with information about the size and shape of your heart and how well your heart's chambers and valves are working. This procedure takes approximately one hour. There are no restrictions for this procedure. Please do NOT wear cologne, perfume, aftershave, or lotions (deodorant is allowed). Please arrive 15 minutes prior to your appointment time.     Follow-Up: At [facility], you and your health needs are our priority.  As part of our continuing mission to provide you with exceptional heart care, we have created designated Provider Care Teams.  These Care Teams include your primary Cardiologist (physician) and Advanced Practice Providers (APPs -  Physician Assistants and Nurse Practitioners) who all work together to provide you with the care you need, when you need it.  We recommend signing up for the patient portal called "MyChart".  Sign up information is provided on this After Visit Summary.  MyChart is used to connect with patients for Virtual Visits (Telemedicine).  Patients are able to view lab/test results, encounter notes, upcoming appointments, etc.  Non-urgent messages can be sent to  your provider as well.   To learn more about what you can do with MyChart, go to [email or web address].    Your next appointment:   Follow up based on results  The format for your next appointment:   In Person  Provider:   Vern Claude Madireddy, MD   Other Instructions Echocardiogram An echocardiogram is a test that uses sound waves (ultrasound) to produce images of the heart. Images from an echocardiogram can provide important information about: Heart size and shape. The size and thickness and movement of your heart's walls. Heart muscle function and strength. Heart valve function or if you have stenosis. Stenosis is when the heart valves are too narrow. If blood is flowing backward through the heart valves (regurgitation). A tumor or infectious growth around the heart valves. Areas of heart muscle that are not working well because of poor blood flow or injury from a heart attack. Aneurysm detection. An aneurysm is a weak or damaged part of an artery wall. The wall bulges out from the normal force of blood pumping through the body. Tell a health care provider about: Any allergies you have. All medicines you are taking, including vitamins, herbs, eye drops, creams, and over-the-counter medicines. Any blood disorders you have. Any surgeries you have had. Any medical conditions you have. Whether you are pregnant or may be pregnant. What are the risks? Generally, this is a safe test. However, problems may occur, including an allergic reaction to dye (contrast) that may be used during the test. What happens before the test? No specific preparation is needed. You may eat and drink normally. What happens during the  test? You will take off your clothes from the waist up and put on a hospital gown. Electrodes or electrocardiogram (ECG)patches may be placed on your chest. The electrodes or patches are then connected to a device that monitors your heart rate and rhythm. You will lie down  on a table for an ultrasound exam. A gel will be applied to your chest to help sound waves pass through your skin. A handheld device, called a transducer, will be pressed against your chest and moved over your heart. The transducer produces sound waves that travel to your heart and bounce back (or "echo" back) to the transducer. These sound waves will be captured in real-time and changed into images of your heart that can be viewed on a video monitor. The images will be recorded on a computer and reviewed by your health care provider. You may be asked to change positions or hold your breath for a short time. This makes it easier to get different views or better views of your heart. In some cases, you may receive contrast through an IV in one of your veins. This can improve the quality of the pictures from your heart. The procedure may vary among health care providers and hospitals.   What can I expect after the test? You may return to your normal, everyday life, including diet, activities, and medicines, unless your health care provider tells you not to do that. Follow these instructions at home: It is up to you to get the results of your test. Ask your health care provider, or the department that is doing the test, when your results will be ready. Keep all follow-up visits. This is important. Summary An echocardiogram is a test that uses sound waves (ultrasound) to produce images of the heart. Images from an echocardiogram can provide important information about the size and shape of your heart, heart muscle function, heart valve function, and other possible heart problems. You do not need to do anything to prepare before this test. You may eat and drink normally. After the echocardiogram is completed, you may return to your normal, everyday life, unless your health care provider tells you not to do that. This information is not intended to replace advice given to you by your health care provider.  Make sure you discuss any questions you have with your health care provider. Document Revised: 06/03/2020 Document Reviewed: 06/03/2020 Elsevier Patient Education  2021 [employer]   Important Information About Sugar

## 2023-12-28 NOTE — Progress Notes (Signed)
 Cardiology Consultation:    Date:  12/28/2023   ID:  Jason Gentry, DOB 06-09-2001, MRN [id number]  PCP:  Velvet Bathe, MD  Cardiologist:  Marlyn Corporal Caroll Cunnington, MD   Referring MD: Rondel Baton, MD   No chief complaint on file.    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  He also reports an episode of syncope over 2 years ago while he was walking on the beach the circumstances with that syncope did not appear consistent with vasovagal.  Problem List Items Addressed This Visit     Syncope and collapse - Primary   Most recent episode on December 26 appears to be a vasovagal episode. Unclear of what the etiology of the syncope that occurred 2 years ago while he was on the beach.  Otherwise healthy no significant cardio pulmonary symptoms or functional limitations.  He does have a smart watch from that he is able to record ECG tracings on his phone and multiple of these reviewed showed normal sinus rhythm.  In the setting we will obtain a transthoracic echocardiogram to rule out any significant cardiac structural and functional abnormalities. Given infrequent symptoms no further cardiac monitoring being pursued. Reassured him about vagal syncope being usually a benign condition. Advised him to take precautions to avoid syncope at the onset of symptoms by sitting down or laying down.   Advised him to keep himself well-hydrated      Relevant Orders   EKG 12-Lead (Completed)   ECHOCARDIOGRAM COMPLETE    Further return to clinic for follow-up based on test results.  History of Present Illness:    Jason Gentry is a 23 y.o. male  who is being seen today for the evaluation of syncope at the request of Rondel Baton, MD. pleasant man here accompanied by American sign language interpreter.  2 mm aneurysm versus infundibulum arising from the left ICA terminus establish care with neurosurgery and plan is to follow-up on an annual basis with imaging.  He had 2 instances of syncope.  One was 2 years ago while he was walking on the beach.  Felt lightheaded suddenly.  Sat down and passed out for few seconds immediately regained consciousness.  No further episodes December 26. 24 September 2025 he was feeling tired, had [employer] for lunch and was feeling nauseated after finishing food and was walking up stairs to the room and continued to feel lightheaded as he opened the door he collapsed, his girlfriend and her father heard the sound and immediately attended to him and he quickly regained consciousness he was helped to the bed and subsequently taken to the ER.  No significant abnormalities on workup in the ER.  Has had no further symptoms in the past 2 months. Healthy young adult works at [employer] where he works on [employer]. Exercises infrequently but active throughout the day. His only symptom appears to be at times feeling that his heart slows down when he is trying to do things actively.  EKG in the clinic today shows sinus rhythm incomplete RBBB morphology, QRS duration 110 ms.  Normal QTc 409 ms.  Does not smoke tobacco. Uses nicotine  vape No alcohol or drug use.    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   Brain aneurysm 10/08/2023   Deaf    uses sign language   Deaf    Dizziness and giddiness 10/24/2017   Granuloma of skin 03/2015   infected granuloma right shoulder   Other peripheral vertigo, unspecified ear 10/24/2017   Syncope and collapse     Past Surgical History:  Procedure Laterality Date   MASS EXCISION Right 04/03/2015    Procedure: EXCISION OF INFECTED GRANULOMA ON RIGHT SHOULDER;  Surgeon: Leonia Corona, MD;  Location: [facility];  Service: Pediatrics;  Laterality: Right;    Current Medications: No outpatient medications have been marked as taking for the 12/28/23 encounter (Office Visit) with Yarethzi Branan, Marlyn Corporal, MD.     Allergies:   Penicillins   Social History   Socioeconomic History   Marital status: Single    Spouse name: Not on file   Number of children: Not on file   Years of education: Not on file   Highest education level: Not on file  Occupational History   Not on file  Tobacco Use   Smoking status: Never    Passive exposure: Yes   Smokeless tobacco: Never   Tobacco comments:    father smokes, but not at home  Vaping Use   Vaping status: Never Used  Substance and Sexual Activity   Alcohol use: No   Drug use: No   Sexual activity: Not on file  Other Topics Concern   Not on file  Social History Narrative   Jason Gentry is in the 10th grade at [employer]; he does well in school. He lives with his parents. He enjoys riding his bike, swimming, walking, and using computers.       Father believes that dizziness is due to computers and phone.   Social Drivers of Corporate investment banker Strain: Not on file  Food Insecurity: Not on file  Transportation Needs: Not on file  Physical Activity: Not on file  Stress: Not on file (09/01/2023)  Social Connections: Unknown (12/25/2022)   Received from [facility]   Social Network    Social Network: Not on file     Family History: The patient's family history includes Deafness in his brother and mother; Hypertension in his maternal grandfather and maternal grandmother. There is no history of Migraines, Seizures, Depression, Anxiety disorder, Bipolar disorder, Schizophrenia, ADD / ADHD, or Autism. ROS:   Please see the history of present illness.    All 14 point review of systems negative except as described  per history of present illness.  EKGs/Labs/Other Studies Reviewed:    The following studies were reviewed today:   EKG:  EKG Interpretation Date/Time:  Wednesday December 28 2023 16:04:40 EST Ventricular Rate:  73 PR Interval:  142 QRS Duration:  110 QT Interval:  372 QTC Calculation: 409 R Axis:   85  Text Interpretation: Normal sinus rhythm Incomplete right bundle branch block When compared with ECG of 20-Oct-2023 21:01, PREVIOUS ECG IS PRESENT Confirmed by Huntley Dec reddy [phone]) on 12/28/2023 4:27:46 PM    Recent Labs: 10/20/2023: BUN 17; Creatinine, Ser 1.05; Hemoglobin 15.1; Platelets 295; Potassium 3.7; Sodium 134  Recent Lipid Panel No results found for: "CHOL", "TRIG", "HDL", "CHOLHDL", "VLDL", "LDLCALC", "LDLDIRECT"  Physical Exam:    VS:  BP 130/80   Pulse 73   Ht 6' (1.829 m)  Wt 158 lb 0.6 oz (71.7 kg)   SpO2 97%   BMI 21.43 kg/m     Wt Readings from Last 3 Encounters:  12/28/23 158 lb 0.6 oz (71.7 kg)  10/20/23 160 lb (72.6 kg)  04/30/21 155 lb 13.8 oz (70.7 kg) (56%, Z= 0.14)*   * Growth percentiles are based on CDC (Boys, 2-20 Years) data.     GENERAL:  Well nourished, well developed in no acute distress NECK: No JVD; No carotid bruits CARDIAC: RRR, S1 and S2 present, no murmurs, no rubs, no gallops CHEST:  Clear to auscultation without rales, wheezing or rhonchi  Extremities: No pitting pedal edema. Pulses bilaterally symmetric with radial 2+ and dorsalis pedis 2+ NEUROLOGIC:  Alert and oriented x 3  Medication Adjustments/Labs and Tests Ordered: Current medicines are reviewed at length with the patient today.  Concerns regarding medicines are outlined above.  Orders Placed This Encounter  Procedures   EKG 12-Lead   ECHOCARDIOGRAM COMPLETE   No orders of the defined types were placed in this encounter.   Signed, Cecille Amsterdam, MD, MPH, [facility]. 12/28/2023 4:51 PM    [facility]

## 2024-01-19 ENCOUNTER — Ambulatory Visit (HOSPITAL_BASED_OUTPATIENT_CLINIC_OR_DEPARTMENT_OTHER)
Admission: RE | Admit: 2024-01-19 | Discharge: 2024-01-19 | Disposition: A | Discharge status: Home / Self Care | Source: Ambulatory Visit

## 2024-01-19 DIAGNOSIS — R55 Syncope and collapse: Secondary | ICD-10-CM | POA: Diagnosis not present

## 2024-01-19 LAB — ECHOCARDIOGRAM COMPLETE
AR max vel: 2.63 cm2
AV Area VTI: 2.43 cm2
AV Area mean vel: 2.72 cm2
AV Mean grad: 3 mmHg
AV Peak grad: 5.1 mmHg
Ao pk vel: 1.13 m/s
Area-P 1/2: 4.36 cm2
Calc EF: 66 %
S' Lateral: 2.9 cm
Single Plane A2C EF: 69.6 %
Single Plane A4C EF: 62.5 %

## 2024-04-25 DIAGNOSIS — M47816 Spondylosis without myelopathy or radiculopathy, lumbar region: Secondary | ICD-10-CM | POA: Diagnosis not present

## 2024-04-25 DIAGNOSIS — M5412 Radiculopathy, cervical region: Secondary | ICD-10-CM | POA: Diagnosis not present

## 2024-05-18 DIAGNOSIS — R41840 Attention and concentration deficit: Secondary | ICD-10-CM | POA: Diagnosis not present

## 2024-07-19 DIAGNOSIS — R41840 Attention and concentration deficit: Secondary | ICD-10-CM | POA: Diagnosis not present

## 2024-07-31 DIAGNOSIS — F411 Generalized anxiety disorder: Secondary | ICD-10-CM | POA: Diagnosis not present

## 2024-09-10 DIAGNOSIS — F900 Attention-deficit hyperactivity disorder, predominantly inattentive type: Secondary | ICD-10-CM | POA: Diagnosis not present

## 2024-09-10 DIAGNOSIS — F411 Generalized anxiety disorder: Secondary | ICD-10-CM | POA: Diagnosis not present

## 2024-10-11 DIAGNOSIS — F900 Attention-deficit hyperactivity disorder, predominantly inattentive type: Secondary | ICD-10-CM | POA: Diagnosis not present

## 2024-10-11 DIAGNOSIS — F411 Generalized anxiety disorder: Secondary | ICD-10-CM | POA: Diagnosis not present

## 2024-10-12 DIAGNOSIS — F909 Attention-deficit hyperactivity disorder, unspecified type: Secondary | ICD-10-CM | POA: Diagnosis not present

## 2024-10-12 DIAGNOSIS — F411 Generalized anxiety disorder: Secondary | ICD-10-CM | POA: Diagnosis not present
